# Patient Record
Sex: Female | Born: 1985 | Race: Black or African American | Hispanic: No | Marital: Single | Smoking: Never smoker
Health system: Southern US, Community
[De-identification: ages and names within clinical notes are randomized; demographics above are authoritative.]

## PROBLEM LIST (undated history)

## (undated) HISTORY — PX: CHOLECYSTECTOMY: SHX55

---
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
## 2006-07-08 ENCOUNTER — Emergency Department (HOSPITAL_COMMUNITY): Admission: EM | Admit: 2006-07-08 | Discharge: 2006-07-08 | Payer: Self-pay | Admitting: Emergency Medicine

## 2006-12-28 ENCOUNTER — Emergency Department (HOSPITAL_COMMUNITY): Admission: EM | Admit: 2006-12-28 | Discharge: 2006-12-28 | Payer: Self-pay | Admitting: Family Medicine

## 2007-02-02 ENCOUNTER — Emergency Department (HOSPITAL_COMMUNITY): Admission: EM | Admit: 2007-02-02 | Discharge: 2007-02-02 | Payer: Self-pay | Admitting: Emergency Medicine

## 2010-11-24 NOTE — Discharge Summary (Signed)
[place]  Patient:    Christie Olson, Christie Olson Visit Number: [id number] MRN: [id number]          Service Type: SUR Location: PEDS 6156 01 Attending Physician:  Leonia Corona Dictated by:   Judie Petit. Leonia Corona, M.D. 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  POSTOPERATIVE DIAGNOSIS:  Pancreatic duct cyst, status post open cholecystectomy and intraoperative cholangiogram.  HISTORY OF PRESENT ILLNESS:  This 25 year old female patient who has been admitted to the hospital earlier for acute epigastric pain was diagnosed to have pancreatitis secondary to choledochal cyst.  She was scheduled for excision of choledochal cyst with a cholecystectomy and intraoperative cholangiogram on the day of admission.  PHYSICAL EXAMINATION:  On physical examination before surgery she was otherwise healthy-appearing female child with benign abdominal examination. She was afebrile with normal vital signs, and CBC and liver function tests were within normal limits.  Abdominal examination was completely benign.  Her MRI and CT scans were consistent with a choledochal cyst, and some chronic changes in the gallbladder.  Hence, the procedure.  HOSPITAL COURSE:  On the day of surgery she was taken to the operating room and the procedure was performed under general anesthesia.  A central venous line in the right subclavian vein was placed at the beginning of the surgery, followed by an exploratory laparotomy, upon which the cystic duct was dissected, and an intraoperative cholangiogram was performed.  The intraoperative cholangiogram findings did not match the findings on MRI and CT scan noted earlier.  We were able to see the terminal common bile duct which was originating from pancreatic duct, and also a blind ending pouch on  the left hepatic duct, and some dilatation of the common bile duct, changing our diagnosis to pancreatic duct cyst causing compression over the common bile duct and dilatation of the common bile duct.  Hence, the planned procedure of excision of choledochal cyst was changed to only and open cholecystectomy, and no further mobilization of the common duct was done as planned earlier.  The surgery was smooth and uneventful, although it went on for such a long period of time due to controversial intraoperative cholangiogram findings. Postoperatively, she remained in the recovery room for a short while, and then was transferred to the pediatric floor for postoperative pain control and postoperative management.  She had also received an epidural catheter placed in the operating room for pain management.  Her postoperative course on the floor was smooth and uneventful.  Pain was very well controlled with epidural medications.  She was kept n.p.o. with IV fluids.  She remained afebrile.  On the day of surgery in the evening we noticed she complained of a little chest pain on the right side, and chest x-ray showed a very small pneumothorax, apical and extending slightly on the lateral side, however, she maintained 100% saturations on room air, no intervention was therefore considered necessary.  She was observed.  The next morning the x-ray did not show any further pneumothorax on the right side. She remained n.p.o. for the first two days, and on the third postoperative day her NG tube was removed, and her labs were within normal limits.  She was receiving cefotetan antibiotic perioperatively.  She was started on clear  liquids on the fourth postoperative day, which was gradually advanced to full liquids and then a soft diet.  On the day of discharge on May 11, 2001, which is the sixth postoperative day, she was in good general condition, afebrile, was able to walk around, and was tolerating  soft diet.  The incision was clean, dry, and intact, and healing very well.  She was discharged from the hospital with oral pain medication, and instructions to follow up in the office in one weeks time. Dictated by:   Judie Petit. Leonia Corona, M.D. Attending Physician:  Leonia Corona DD:  05/21/01 TD:  05/21/01 Job: [id number]

## 2010-11-24 NOTE — Op Note (Signed)
[place]. [facility]  Patient:    Christie Olson, Christie Olson Visit Number: [id number] MRN: [id number]          Service Type: SUR Location: PEDS 6148 01 Attending Physician:  Leonia Corona Dictated by:   Judie Petit. Leonia Corona, M.D. Proc. Date: 05/06/01 Admit Date:  05/05/2001   CC:         Linward Headland, M.D.                           Operative Report  The patient is a 25 year old female child.  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  PROCEDURES PERFORMED: 1. Placement of central venous line in right subclavian vein. 2. Exploratory laparotomy with open cholecystectomy. 3. Intraoperative cholangiopancreatogram.  SURGEON:  M. Leonia Corona, M.D.  ASSISTANTDonnella Bi D. Pendse, M.D.  ANESTHESIA:  General endotracheal.  INDICATIONS FOR PROCEDURE:  This 25 year old female child was admitted through the emergency room about a month ago for acute upper abdominal pain which was diagnosed to be acute pancreatitis with possibly cholecystitis with raised pancreatic enzymes and liver enzymes and bilirubin.  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 The patient was initially treated non-operatively, and then discharged to home for a planned surgery of excision of the choledochal cyst and Roux-en-Y anastomosis, hence this procedure.  DESCRIPTION OF PROCEDURE:  The patient is brought into the operating room, placed supine on operating room table.  General endotracheal tube anesthesia is given.  A Foley catheter #14 was placed with sterile precautions.  The right side of the chest and down to the neck was cleaned, prepped and draped in usual manner for the  placement of the central venous line.  The patient was put in Trendelenburg position.  A triple lumen catheter 7 [place] kit was used for access to the right subclavian vein.  The access was obtained with four puncture, and free backflow of the venous blood was obtained.  With the long needle-syringe the ______ was disconnected and a guide wire was introduced under fluoroscopic control into the right side of the heart, and the needle was withdrawn.  The tract was dilated using the dilator available in the kit, and then the triple lumen catheter was fed over the guide wire without any difficulty.  The guide wire was withdrawn.  The ports were flushed with normal saline, and then the catheter was secured in the skin with 3-0 silk sutures and a sterile gauze was placed.  The line was available for immediate use. The patient was returned back to a flat position.  The entire abdomen was cleaned, prepped and draped in usual manner.  We changed gowns, and once again got ready for the main part of the procedure which was exploratory laparotomy. A right subcostal incision, approximately 1.5 to 2 cm below the right subcostal margin was made, starting just to the left of the midline and going subcostally on the right side obliquely, and incision was made with a knife, and then deepened through the subcutaneous tissues with electrocautery.  The muscles and sheath were incised in the line of the incision, and then the peritoneum was opened in the middle.  After a small opening into the peritoneum, the finger  was inserted in the peritoneum, and with the help of cautery, the abdomen was opened along the entire length of the incision.  A preliminary examination of the liver surface was done.  Liver appeared to be normal without any microscopic abnormality.  The gallbladder appeared to be very thickened, larger in size.  There were no pericystic fluid or any collection around the gallbladder.  However, the  small bowel was runned from the ligament of Treitz to the ileocecal junction without any obvious abnormality.  The first loop of the jejunum was chosen, and marked with the help of silk suture on the ______ border for suture used off ______ for the hepaticojejunostomy, as per the plan.  We now placed a self-retaining retractor.  Buchwalter retractor was placed, and all the loops of the bowel were packed inferiorly.  A good access of the portahepatis was obtained.  The structures of the portahepatus were examined.  The cystic duct appeared to be very prominent and full.  The entire gallbladder was full.  We started by mobilizing the duodenum.  The second and third part of the duodenum was carefully mobilized by incising the overlying peritoneum and carefully taking it down and mobilizing the duodenum towards the medial size.  Thus cauterization of the duodenum was achieved.  After that, we turned towards the junction of the cystic duct and the common bile duct which appeared to be fairly good size, about 1 cm in size.  A very careful dissection around the common bile duct was done with the help of blunted right angle, very careful dissection posteriorly was done to get a good plane between the portal vein and the common bile duct.  The junction of the cystic duct and common bile duct were cleared from all sides circumferentially, and then a vessel loop was passed behind the common bile duct.  The distal dissection of the common bile duct towards the duodenum was done as far down as the wall of the duodenum, however, no further dissection into the wall of the duodenum was done.  At this point, we decided to prepare for the intraoperative cholangiogram.  We started by mobilizing the gallbladder.  The fundus of the gallbladder was held with ______ forceps and the gallbladder was dissected free from the gallbladder fossa with the help of a cautery until the infundibulum was reached, at which  point we dissected the cystic duct carefully with the help of a blunted right angle forceps to go around it.  The cystic duct was  dissected free on all sides, and a vessel loop was passed behind it, and cystic duct was clipped.  At this point, the cystic artery was very well visualized in the ______ angle which was now divided between two clips, and clips were applied.  The gallbladder was almost free at this point.  We were ready for intraoperative cholangiogram.  A ______ catheter was now introduced into the cystic duct by making a small opening with the 11 blade into the cystic duct, and a clamp was applied without cholangiogram catheter of free flushing was done with normal saline which was followed by contrast approximately 5 to 7 cc of contrast was injected under direct fluoroscopic control, and the entire biliary system was visualized.  Several pictures were obtained, however, no clear conclusion was drawn from the distal part of the common bile duct, and no delineation of the presumed choledochal cyst was noted.  The radiologist were consulted to give opinion on these pictures, who could  not very clearly define the choledochal cyst.  We therefore requested the presence of the radiologist in the operating room.  Under his advice, several other pictures were obtained once more by injecting a second time the contrast through the cystic duct to obtain a few more views of cholangiogram, and finally we got cleared ______ appreciable pictures separating the common bile duct from the pancreatic duct, and presumed choledochal cyst on the CAT scan and MRCP actually turned out to be congenital pancreatic ductal cyst. Although there was some amount of hold up at the ampulla of Vater of free flow of the contrast could be seen into the duodenum.  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  In view of these changed diagnosis from choledochal cyst to pancreatic duct  cyst, and an additional pouch in the left hepatic duct, we changed our plan from doing a Roux-en-Y hepaticojejunostomy to a simple open cholecystectomy, as no further dissection of the common bile duct was done.  Hence, we divided the gallbladder from the cystic duct, and multiple clips were applied on the cystic duct.  Hence, the gallbladder was removed from the field.  The peritoneal cavity was irrigated with normal warm saline.  Once again, the entire portahepatus was noted, no venous bleeding was noted from the gallbladder fossa.  The common hepatic duct, common bile duct appeared to be well distended and well visualized without any evidence of stricture or obstruction.  We therefore decided to do only an open cholecystectomy, and not do any kind of biliary anastomosis.  The previously placed marker on the jejunal loop using silk was also removed.  The peritoneal cavity was once again irrigated with copious amounts of normal saline, and then loops of the small bowel were placed back normally, and all the packs were removed.  The abdomen was closed in layers.  The peritoneum using 2-0 Vicryl continuous stitches, and then the muscles were approximated using 2-0 Vicryl interrupted stitches, and the skin was closed in two layers; the subcutaneous layer using 3-0 Vicryl interrupted stitch and the skin with 4-0 Monocryl subcuticular stitch.  Steri-Strips were applied which was covered with sterile gauze and Tegaderm dressing.  The patient tolerated the procedure very well.  The patient was hemodynamically stable throughout the procedure.  ESTIMATED BLOOD LOSS:  Less than 30 cc.  The patient was given about 2000 cc of IV fluids, crystalloids during the procedure.  The patient made about 300 cc of urine.  The patient was extubated, and transported to the recovery room after placement of an epidural catheter in good and stable condition. Dictated by:   Judie Petit. Leonia Corona, M.D. Attending  Physician:  Leonia Corona DD:  05/06/01 TD:  05/07/01 Job: [id number]

## 2011-04-23 LAB — DIFFERENTIAL
Basophils Absolute: 0
Basophils Relative: 0
Neutro Abs: 7.8 — ABNORMAL HIGH
Neutrophils Relative %: 77

## 2011-04-23 LAB — CBC
MCHC: 34.3
Platelets: 408 — ABNORMAL HIGH
RBC: 4.16
RDW: 13.6

## 2011-04-25 LAB — POCT URINALYSIS DIP (DEVICE)
Operator id: 239701
Protein, ur: 30 — AB
Urobilinogen, UA: 1
pH: 6.5

## 2011-04-25 LAB — POCT PREGNANCY, URINE
Operator id: 239701
Preg Test, Ur: NEGATIVE

## 2012-06-05 ENCOUNTER — Emergency Department (HOSPITAL_COMMUNITY)
Admission: EM | Admit: 2012-06-05 | Discharge: 2012-06-06 | Disposition: A | Discharge status: Home / Self Care | Payer: Worker's Compensation | Attending: Emergency Medicine | Admitting: Emergency Medicine

## 2012-06-05 ENCOUNTER — Emergency Department (HOSPITAL_COMMUNITY): Payer: Worker's Compensation

## 2012-06-05 ENCOUNTER — Encounter (HOSPITAL_COMMUNITY): Payer: Self-pay | Admitting: Emergency Medicine

## 2012-06-05 DIAGNOSIS — Y9289 Other specified places as the place of occurrence of the external cause: Secondary | ICD-10-CM | POA: Insufficient documentation

## 2012-06-05 DIAGNOSIS — R110 Nausea: Secondary | ICD-10-CM | POA: Insufficient documentation

## 2012-06-05 DIAGNOSIS — Y9301 Activity, walking, marching and hiking: Secondary | ICD-10-CM | POA: Insufficient documentation

## 2012-06-05 DIAGNOSIS — H53149 Visual discomfort, unspecified: Secondary | ICD-10-CM | POA: Insufficient documentation

## 2012-06-05 DIAGNOSIS — W1809XA Striking against other object with subsequent fall, initial encounter: Secondary | ICD-10-CM | POA: Insufficient documentation

## 2012-06-05 DIAGNOSIS — S0990XA Unspecified injury of head, initial encounter: Secondary | ICD-10-CM | POA: Insufficient documentation

## 2012-06-05 MED ORDER — ONDANSETRON 8 MG PO TBDP
8.0000 mg | ORAL_TABLET | Freq: Once | ORAL | Status: AC
  Administered 2012-06-05: 8 mg via ORAL
  Filled 2012-06-05: qty 1

## 2012-06-05 NOTE — ED Notes (Signed)
Pt states she was leaving work and slipped on some ice and fell forward striking her head and her right knee on the pavement   Denies LOC   Pt is c/o nausea and states light hurts her eyes

## 2012-06-05 NOTE — ED Provider Notes (Signed)
History     CSN: [id number]  Arrival date & time 06/05/12  2221   First MD Initiated Contact with Patient 06/05/12 2300      Chief Complaint  Patient presents with  . Fall  . Head Injury    (Consider location/radiation/quality/duration/timing/severity/associated sxs/prior treatment) HPI Comments: Patient states she slipped on ice hitting her forehead on the ground Denies LOC, neck pain other injury,  now has nausea without vomiting   Patient is a 26 y.o. female presenting with fall and head injury. The history is provided by the patient.  Fall The accident occurred 1 to 2 hours ago. The fall occurred while walking. She fell from a height of 3 to 5 ft. She landed on concrete. The point of impact was the head. The pain is present in the head. The pain is at a severity of 4/10. The pain is moderate. She was ambulatory at the scene. There was no entrapment after the fall. There was no drug use involved in the accident. There was no alcohol use involved in the accident. Associated symptoms include nausea. Pertinent negatives include no visual change, no fever, no vomiting, no headaches and no loss of consciousness. She has tried nothing for the symptoms.  Head Injury  Pertinent negatives include no vomiting and no weakness.    History reviewed. No pertinent past medical history.  Past Surgical History  Procedure Date  . Cholecystectomy     Family History  Problem Relation Age of Onset  . Diabetes Other   . Cancer Other   . Hypertension Other     History  Substance Use Topics  . Smoking status: Never Smoker   . Smokeless tobacco: Not on file  . Alcohol Use: Yes     Comment: socially    OB History    Grav Para Term Preterm Abortions TAB SAB Ect Mult Living                  Review of Systems  Constitutional: Negative for fever and activity change.  HENT: Negative for neck pain and neck stiffness.   Eyes: Positive for photophobia.  Gastrointestinal: Positive for  nausea. Negative for vomiting.  Skin: Negative for wound.  Neurological: Negative for dizziness, loss of consciousness, weakness and headaches.    Allergies  Review of patient's allergies indicates no known allergies.  Home Medications   Current Outpatient Rx  Name  Route  Sig  Dispense  Refill  . ONDANSETRON HCL 4 MG PO TABS   Oral   Take 1 tablet (4 mg total) by mouth every 6 (six) hours.   12 tablet   0     BP 124/73  Pulse 67  Temp 98.1 F (36.7 C) (Oral)  Resp 18  SpO2 99%  LMP 04/06/2012  Physical Exam  Constitutional: She is oriented to person, place, and time. She appears well-developed and well-nourished.  HENT:  Head: Normocephalic.  Eyes: Pupils are equal, round, and reactive to light.  Neck: Normal range of motion.  Cardiovascular: Normal rate.   Musculoskeletal: Normal range of motion.  Neurological: She is alert and oriented to person, place, and time.  Skin: Skin is warm.    ED Course  Procedures (including critical care time)  Labs Reviewed - No data to display Ct Head Wo Contrast  06/05/2012  *RADIOLOGY REPORT*  Clinical Data: Status post fall; hit head.  Nausea and photophobia.  CT HEAD WITHOUT CONTRAST  Technique:  Contiguous axial images were obtained from the base  of the skull through the vertex without contrast.  Comparison: None.  Findings: There is no evidence of acute infarction, mass lesion, or intra- or extra-axial hemorrhage on CT.  The posterior fossa, including the cerebellum, brainstem and fourth ventricle, is within normal limits.  The third and lateral ventricles, and basal ganglia are unremarkable in appearance.  The cerebral hemispheres are symmetric in appearance, with normal gray- white differentiation.  No mass effect or midline shift is seen.  There is no evidence of fracture; visualized osseous structures are unremarkable in appearance.  The visualized portions of the orbits are within normal limits.  The paranasal sinuses and  mastoid air cells are well-aerated.  No significant soft tissue abnormalities are seen.  IMPRESSION: No evidence of traumatic intracranial injury or fracture.   Original Report Authenticated By: Tonia Ghent, M.D.      1. Minor head injury       MDM  After Zofran nausea has resolved but still slightly photophobic  CT Scan reviewed no acute pathology to explain symptoms        Arman Filter, NP 06/06/12 0030

## 2012-06-05 NOTE — ED Notes (Signed)
Pt sts she was at work when she fell in the parking lot and hit her head due to ice on the asphalt. Patient sts no LOC, alert and oriented. Pt sts no dizziness or confusion. Sts she has felt slightly nauseous, medicated patient with 8mg  zofran ODT.

## 2012-06-06 MED ORDER — ONDANSETRON HCL 4 MG PO TABS: 4.0000 mg | ORAL_TABLET | Freq: Four times a day (QID) | ORAL | Status: AC

## 2012-06-06 NOTE — ED Notes (Signed)
Patient given discharge instructions, information, prescriptions, and diet order. Patient states that they adequately understand discharge information given and to return to ED if symptoms return or worsen.     

## 2012-06-06 NOTE — ED Provider Notes (Signed)
Medical screening examination/treatment/procedure(s) were performed by non-physician practitioner and as supervising physician I was immediately available for consultation/collaboration.    Vida Roller, MD 06/06/12 [phone]

## 2013-12-27 IMAGING — CR DG CERVICAL SPINE 2-3V
2 series · 2 of 2 positions shown · non-contrast
Comparison: None.

CLINICAL DATA: Pain

CERVICAL SPINE - 2-3 VIEW

[lateral]
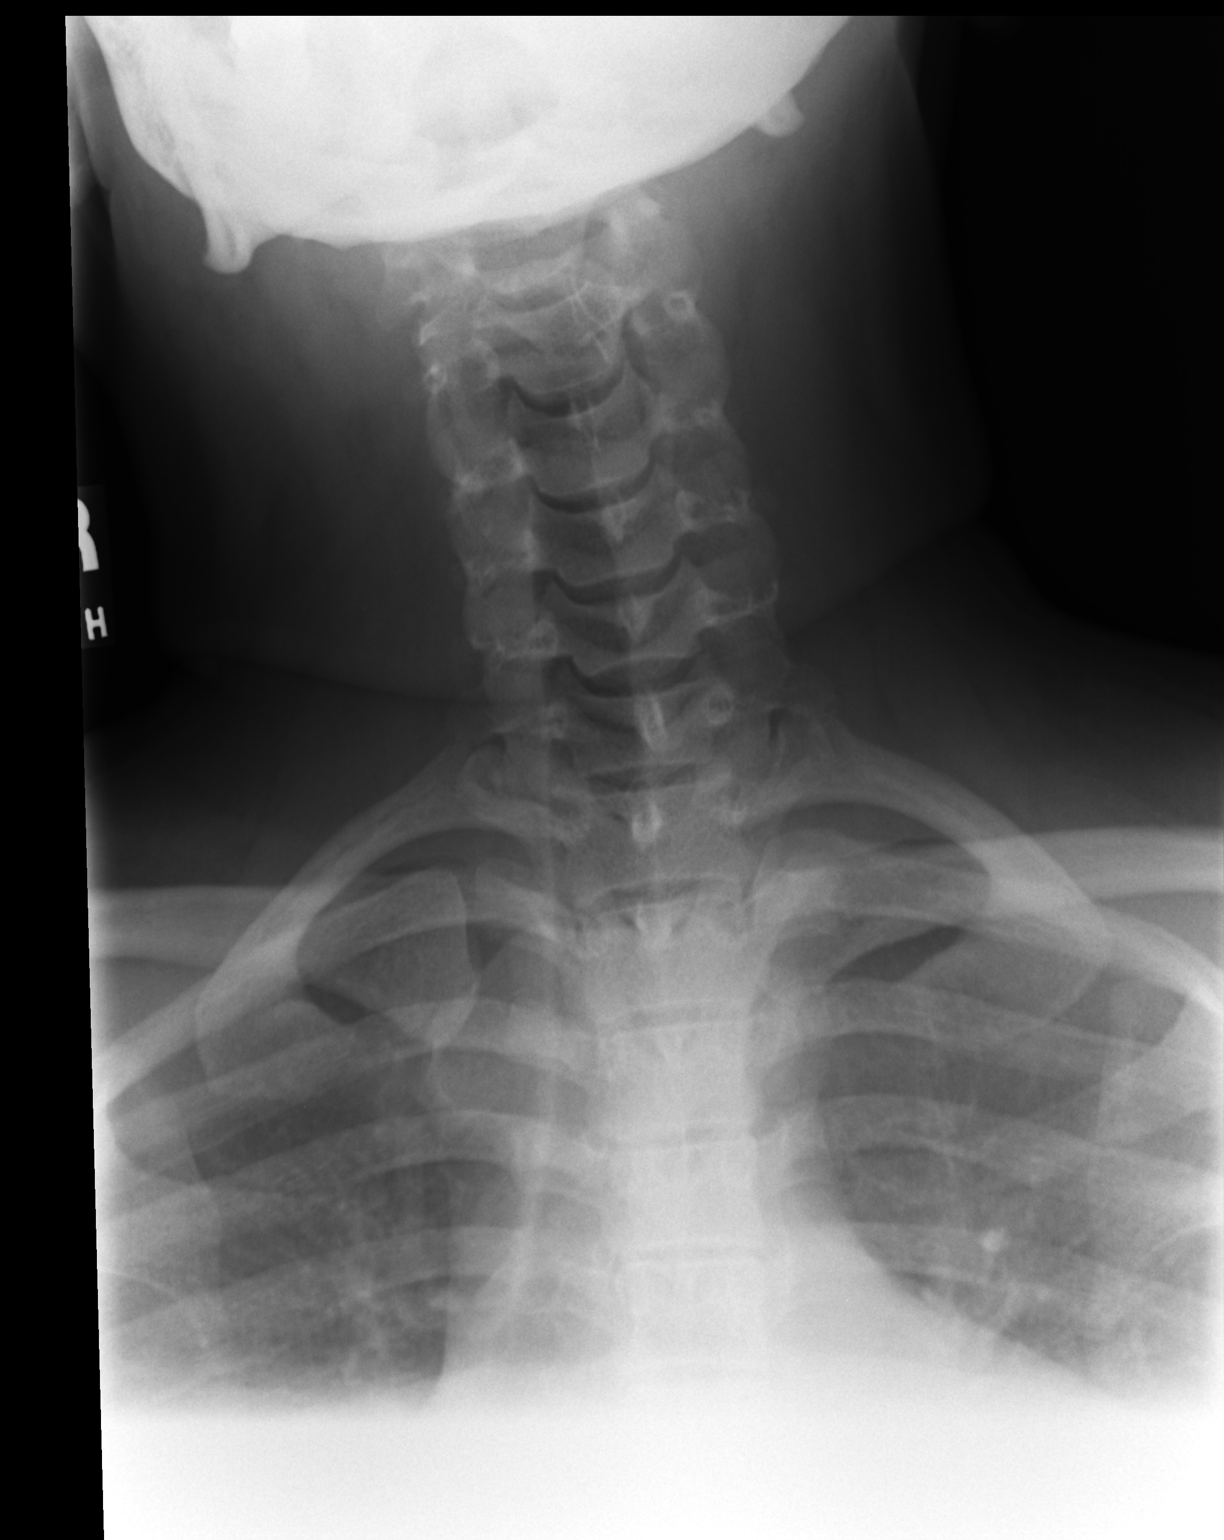

[AP]
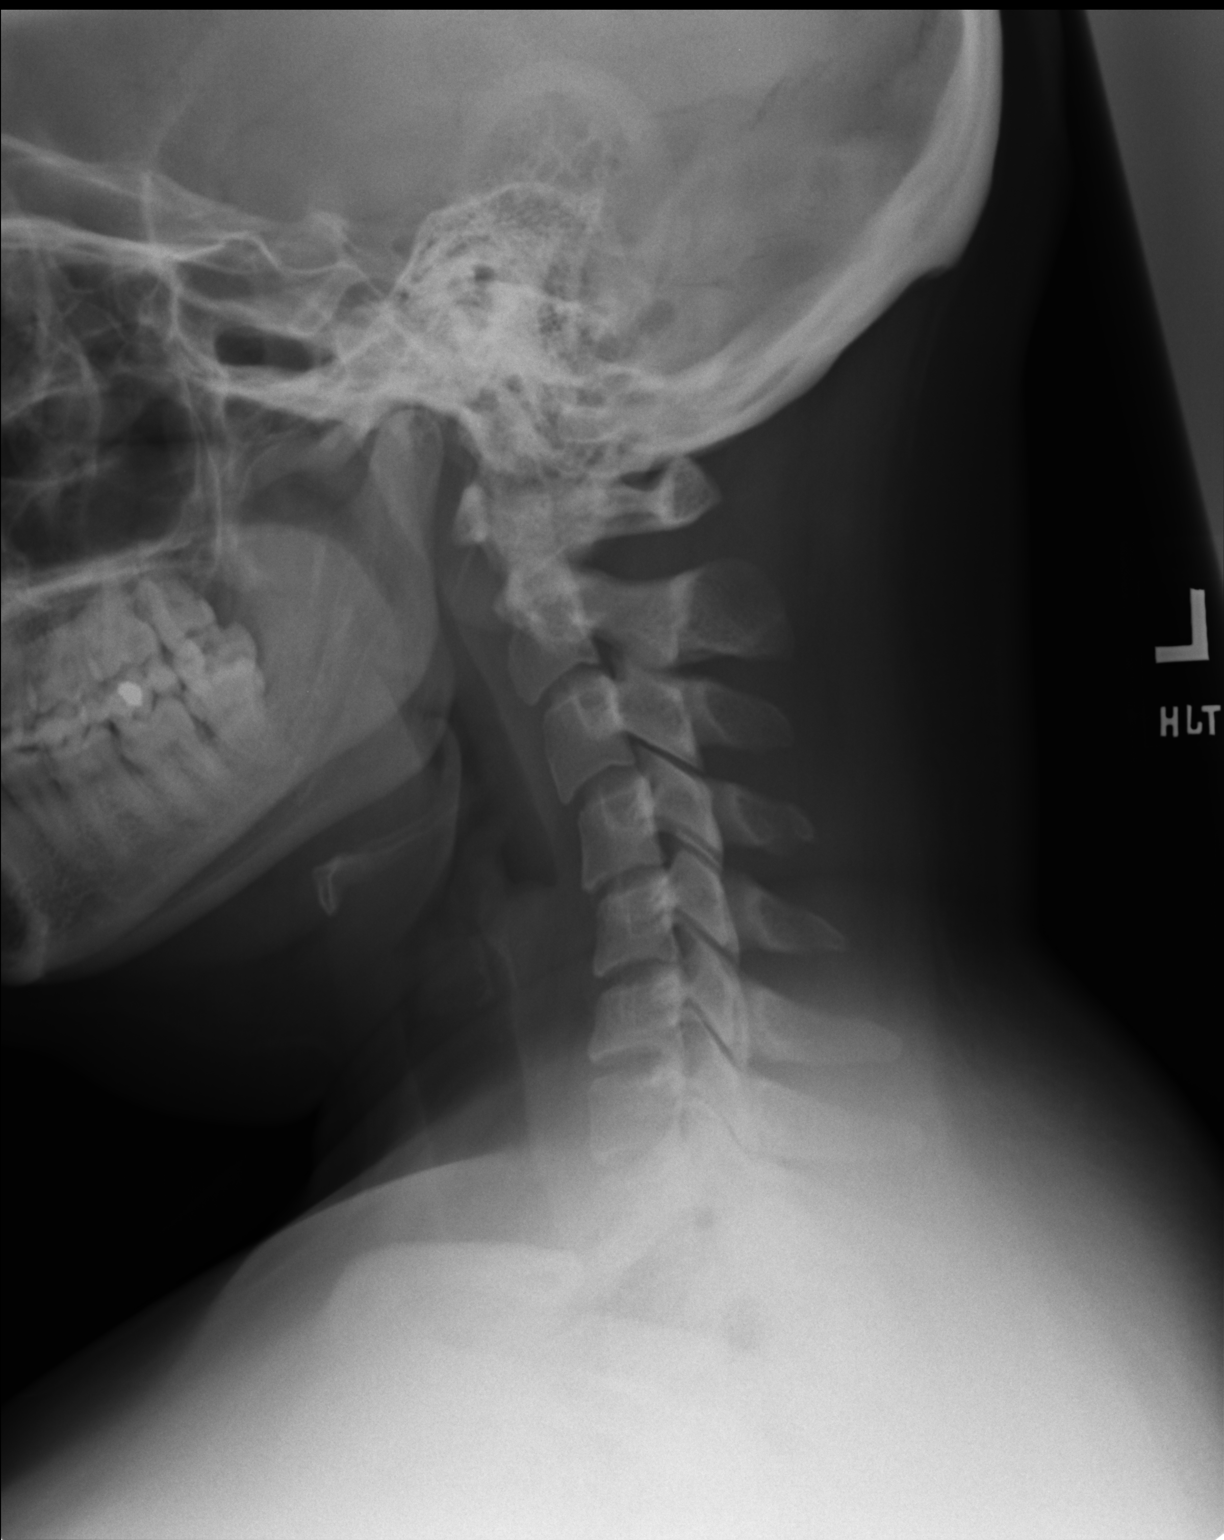

[2 of 2 positions shown; findings below may reference images not displayed]

FINDINGS: Frontal and lateral views were obtained.  There is
cervicothoracic levoscoliosis.  There is reversal lordotic
curvature.

There is no fracture or spondylolisthesis.  Prevertebral soft
tissues and predental space regions are normal.  Disc spaces appear
intact.  No erosive change.
IMPRESSION: Scoliosis and reversal lordotic curvature.  Suspect muscle spasm.
There is concern for ligamentous injury, lateral flexion and
extension views could be helpful to further assess.

No fracture or spondylolisthesis.  No appreciable arthropathic
change.

## 2015-05-28 ENCOUNTER — Encounter (HOSPITAL_BASED_OUTPATIENT_CLINIC_OR_DEPARTMENT_OTHER): Payer: Self-pay | Admitting: Emergency Medicine

## 2015-05-28 ENCOUNTER — Emergency Department (HOSPITAL_BASED_OUTPATIENT_CLINIC_OR_DEPARTMENT_OTHER)
Admission: EM | Admit: 2015-05-28 | Discharge: 2015-05-28 | Disposition: A | Discharge status: Home / Self Care | Payer: Commercial Managed Care - PPO | Attending: Emergency Medicine | Admitting: Emergency Medicine

## 2015-05-28 DIAGNOSIS — M542 Cervicalgia: Secondary | ICD-10-CM | POA: Diagnosis present

## 2015-05-28 MED ORDER — NAPROXEN 500 MG PO TABS: 500.0000 mg | ORAL_TABLET | Freq: Two times a day (BID) | ORAL | Status: AC

## 2015-05-28 MED ORDER — METHOCARBAMOL 500 MG PO TABS: 500.0000 mg | ORAL_TABLET | Freq: Two times a day (BID) | ORAL | Status: AC

## 2015-05-28 MED ORDER — KETOROLAC TROMETHAMINE 60 MG/2ML IM SOLN
60.0000 mg | Freq: Once | INTRAMUSCULAR | Status: AC
  Administered 2015-05-28: 60 mg via INTRAMUSCULAR
  Filled 2015-05-28: qty 2

## 2015-05-28 NOTE — ED Notes (Signed)
Patient states that she is having pain to her neck  - down her left side, and her left neck

## 2015-05-28 NOTE — ED Notes (Signed)
Patient is moving her neck without any grimacing or in any distress

## 2015-05-28 NOTE — Discharge Instructions (Signed)
Scheduled follow-up appointment with your primary care doctor. Robaxin as a muscle relaxer and will make you drowsy. Do not take before driving.   Cervical Strain and Sprain With Rehab Cervical strain and sprain are injuries that commonly occur with "whiplash" injuries. Whiplash occurs when the neck is forcefully whipped backward or forward, such as during a motor vehicle accident or during contact sports. The muscles, ligaments, tendons, discs, and nerves of the neck are susceptible to injury when this occurs. RISK FACTORS Risk of having a whiplash injury increases if:  Osteoarthritis of the spine.  Situations that make head or neck accidents or trauma more likely.  High-risk sports (football, rugby, wrestling, hockey, auto racing, gymnastics, diving, contact karate, or boxing).  Poor strength and flexibility of the neck.  Previous neck injury.  Poor tackling technique.  Improperly fitted or padded equipment. SYMPTOMS   Pain or stiffness in the front or back of neck or both.  Symptoms may present immediately or up to 24 hours after injury.  Dizziness, headache, nausea, and vomiting.  Muscle spasm with soreness and stiffness in the neck.  Tenderness and swelling at the injury site. PREVENTION  Learn and use proper technique (avoid tackling with the head, spearing, and head-butting; use proper falling techniques to avoid landing on the head).  Warm up and stretch properly before activity.  Maintain physical fitness:  Strength, flexibility, and endurance.  Cardiovascular fitness.  Wear properly fitted and padded protective equipment, such as padded soft collars, for participation in contact sports. PROGNOSIS  Recovery from cervical strain and sprain injuries is dependent on the extent of the injury. These injuries are usually curable in 1 week to 3 months with appropriate treatment.  RELATED COMPLICATIONS   Temporary numbness and weakness may occur if the nerve roots  are damaged, and this may persist until the nerve has completely healed.  Chronic pain due to frequent recurrence of symptoms.  Prolonged healing, especially if activity is resumed too soon (before complete recovery). TREATMENT  Treatment initially involves the use of ice and medication to help reduce pain and inflammation. It is also important to perform strengthening and stretching exercises and modify activities that worsen symptoms so the injury does not get worse. These exercises may be performed at home or with a therapist. For patients who experience severe symptoms, a soft, padded collar may be recommended to be worn around the neck.  Improving your posture may help reduce symptoms. Posture improvement includes pulling your chin and abdomen in while sitting or standing. If you are sitting, sit in a firm chair with your buttocks against the back of the chair. While sleeping, try replacing your pillow with a small towel rolled to 2 inches in diameter, or use a cervical pillow or soft cervical collar. Poor sleeping positions delay healing.  For patients with nerve root damage, which causes numbness or weakness, the use of a cervical traction apparatus may be recommended. Surgery is rarely necessary for these injuries. However, cervical strain and sprains that are present at birth (congenital) may require surgery. MEDICATION   If pain medication is necessary, nonsteroidal anti-inflammatory medications, such as aspirin and ibuprofen, or other minor pain relievers, such as acetaminophen, are often recommended.  Do not take pain medication for 7 days before surgery.  Prescription pain relievers may be given if deemed necessary by your caregiver. Use only as directed and only as much as you need. HEAT AND COLD:   Cold treatment (icing) relieves pain and reduces inflammation. Cold treatment  should be applied for 10 to 15 minutes every 2 to 3 hours for inflammation and pain and immediately after any  activity that aggravates your symptoms. Use ice packs or an ice massage.  Heat treatment may be used prior to performing the stretching and strengthening activities prescribed by your caregiver, physical therapist, or athletic trainer. Use a heat pack or a warm soak. SEEK MEDICAL CARE IF:   Symptoms get worse or do not improve in 2 weeks despite treatment.  New, unexplained symptoms develop (drugs used in treatment may produce side effects). EXERCISES RANGE OF MOTION (ROM) AND STRETCHING EXERCISES - Cervical Strain and Sprain These exercises may help you when beginning to rehabilitate your injury. In order to successfully resolve your symptoms, you must improve your posture. These exercises are designed to help reduce the forward-head and rounded-shoulder posture which contributes to this condition. Your symptoms may resolve with or without further involvement from your physician, physical therapist or athletic trainer. While completing these exercises, remember:   Restoring tissue flexibility helps normal motion to return to the joints. This allows healthier, less painful movement and activity.  An effective stretch should be held for at least 20 seconds, although you may need to begin with shorter hold times for comfort.  A stretch should never be painful. You should only feel a gentle lengthening or release in the stretched tissue. STRETCH- Axial Extensors  Lie on your back on the floor. You may bend your knees for comfort. Place a rolled-up hand towel or dish towel, about 2 inches in diameter, under the part of your head that makes contact with the floor.  Gently tuck your chin, as if trying to make a "double chin," until you feel a gentle stretch at the base of your head.  Hold __________ seconds. Repeat __________ times. Complete this exercise __________ times per day.  STRETCH - Axial Extension   Stand or sit on a firm surface. Assume a good posture: chest up, shoulders drawn back,  abdominal muscles slightly tense, knees unlocked (if standing) and feet hip width apart.  Slowly retract your chin so your head slides back and your chin slightly lowers. Continue to look straight ahead.  You should feel a gentle stretch in the back of your head. Be certain not to feel an aggressive stretch since this can cause headaches later.  Hold for __________ seconds. Repeat __________ times. Complete this exercise __________ times per day. STRETCH - Cervical Side Bend   Stand or sit on a firm surface. Assume a good posture: chest up, shoulders drawn back, abdominal muscles slightly tense, knees unlocked (if standing) and feet hip width apart.  Without letting your nose or shoulders move, slowly tip your right / left ear to your shoulder until your feel a gentle stretch in the muscles on the opposite side of your neck.  Hold __________ seconds. Repeat __________ times. Complete this exercise __________ times per day. STRETCH - Cervical Rotators   Stand or sit on a firm surface. Assume a good posture: chest up, shoulders drawn back, abdominal muscles slightly tense, knees unlocked (if standing) and feet hip width apart.  Keeping your eyes level with the ground, slowly turn your head until you feel a gentle stretch along the back and opposite side of your neck.  Hold __________ seconds. Repeat __________ times. Complete this exercise __________ times per day. RANGE OF MOTION - Neck Circles   Stand or sit on a firm surface. Assume a good posture: chest up, shoulders drawn  back, abdominal muscles slightly tense, knees unlocked (if standing) and feet hip width apart.  Gently roll your head down and around from the back of one shoulder to the back of the other. The motion should never be forced or painful.  Repeat the motion 10-20 times, or until you feel the neck muscles relax and loosen. Repeat __________ times. Complete the exercise __________ times per day. STRENGTHENING EXERCISES  - Cervical Strain and Sprain These exercises may help you when beginning to rehabilitate your injury. They may resolve your symptoms with or without further involvement from your physician, physical therapist, or athletic trainer. While completing these exercises, remember:   Muscles can gain both the endurance and the strength needed for everyday activities through controlled exercises.  Complete these exercises as instructed by your physician, physical therapist, or athletic trainer. Progress the resistance and repetitions only as guided.  You may experience muscle soreness or fatigue, but the pain or discomfort you are trying to eliminate should never worsen during these exercises. If this pain does worsen, stop and make certain you are following the directions exactly. If the pain is still present after adjustments, discontinue the exercise until you can discuss the trouble with your clinician. STRENGTH - Cervical Flexors, Isometric  Face a wall, standing about 6 inches away. Place a small pillow, a ball about 6-8 inches in diameter, or a folded towel between your forehead and the wall.  Slightly tuck your chin and gently push your forehead into the soft object. Push only with mild to moderate intensity, building up tension gradually. Keep your jaw and forehead relaxed.  Hold 10 to 20 seconds. Keep your breathing relaxed.  Release the tension slowly. Relax your neck muscles completely before you start the next repetition. Repeat __________ times. Complete this exercise __________ times per day. STRENGTH- Cervical Lateral Flexors, Isometric   Stand about 6 inches away from a wall. Place a small pillow, a ball about 6-8 inches in diameter, or a folded towel between the side of your head and the wall.  Slightly tuck your chin and gently tilt your head into the soft object. Push only with mild to moderate intensity, building up tension gradually. Keep your jaw and forehead relaxed.  Hold 10  to 20 seconds. Keep your breathing relaxed.  Release the tension slowly. Relax your neck muscles completely before you start the next repetition. Repeat __________ times. Complete this exercise __________ times per day. STRENGTH - Cervical Extensors, Isometric   Stand about 6 inches away from a wall. Place a small pillow, a ball about 6-8 inches in diameter, or a folded towel between the back of your head and the wall.  Slightly tuck your chin and gently tilt your head back into the soft object. Push only with mild to moderate intensity, building up tension gradually. Keep your jaw and forehead relaxed.  Hold 10 to 20 seconds. Keep your breathing relaxed.  Release the tension slowly. Relax your neck muscles completely before you start the next repetition. Repeat __________ times. Complete this exercise __________ times per day. POSTURE AND BODY MECHANICS CONSIDERATIONS - Cervical Strain and Sprain Keeping correct posture when sitting, standing or completing your activities will reduce the stress put on different body tissues, allowing injured tissues a chance to heal and limiting painful experiences. The following are general guidelines for improved posture. Your physician or physical therapist will provide you with any instructions specific to your needs. While reading these guidelines, remember:  The exercises prescribed by your provider  will help you have the flexibility and strength to maintain correct postures.  The correct posture provides the optimal environment for your joints to work. All of your joints have less wear and tear when properly supported by a spine with good posture. This means you will experience a healthier, less painful body.  Correct posture must be practiced with all of your activities, especially prolonged sitting and standing. Correct posture is as important when doing repetitive low-stress activities (typing) as it is when doing a single heavy-load activity  (lifting). PROLONGED STANDING WHILE SLIGHTLY LEANING FORWARD When completing a task that requires you to lean forward while standing in one place for a long time, place either foot up on a stationary 2- to 4-inch high object to help maintain the best posture. When both feet are on the ground, the low back tends to lose its slight inward curve. If this curve flattens (or becomes too large), then the back and your other joints will experience too much stress, fatigue more quickly, and can cause pain.  RESTING POSITIONS Consider which positions are most painful for you when choosing a resting position. If you have pain with flexion-based activities (sitting, bending, stooping, squatting), choose a position that allows you to rest in a less flexed posture. You would want to avoid curling into a fetal position on your side. If your pain worsens with extension-based activities (prolonged standing, working overhead), avoid resting in an extended position such as sleeping on your stomach. Most people will find more comfort when they rest with their spine in a more neutral position, neither too rounded nor too arched. Lying on a non-sagging bed on your side with a pillow between your knees, or on your back with a pillow under your knees will often provide some relief. Keep in mind, being in any one position for a prolonged period of time, no matter how correct your posture, can still lead to stiffness. WALKING Walk with an upright posture. Your ears, shoulders, and hips should all line up. OFFICE WORK When working at a desk, create an environment that supports good, upright posture. Without extra support, muscles fatigue and lead to excessive strain on joints and other tissues. CHAIR:  A chair should be able to slide under your desk when your back makes contact with the back of the chair. This allows you to work closely.  The chair's height should allow your eyes to be level with the upper part of your monitor  and your hands to be slightly lower than your elbows.  Body position:  Your feet should make contact with the floor. If this is not possible, use a foot rest.  Keep your ears over your shoulders. This will reduce stress on your neck and low back.   This information is not intended to replace advice given to you by your health care provider. Make sure you discuss any questions you have with your health care provider.   Document Released: 06/25/2005 Document Revised: 07/16/2014 Document Reviewed: 10/07/2008 Elsevier Interactive Patient Education [employer]

## 2015-05-28 NOTE — ED Provider Notes (Signed)
CSN: [id number]     Arrival date & time 05/28/15  2007 History   First MD Initiated Contact with Patient 05/28/15 2157     Chief Complaint  Patient presents with  . Neck Pain   HPI  Christie Olson is a 29 year old female presenting with neck pain she states that the pain began after she woke up this morning. The pain is left-sided and radiates into her shoulder. The pain does not radiate down to her arm. She describes the pain as sharp and stabbing. The pain is exacerbated by rotation of the neck to the left. She has not tried anything for the neck pain. She denies trauma or injury to the neck. She has no other complaints at this time. Denies fevers, chills, headaches, changes in vision, sore throat, rashes or numbness/weakness in the upper extremities.  History reviewed. No pertinent past medical history. Past Surgical History  Procedure Laterality Date  . Cholecystectomy     Family History  Problem Relation Age of Onset  . Diabetes Other   . Cancer Other   . Hypertension Other    Social History  Substance Use Topics  . Smoking status: Never Smoker   . Smokeless tobacco: None  . Alcohol Use: Yes     Comment: socially   OB History    No data available     Review of Systems  Constitutional: Negative for fever and chills.  HENT: Negative for congestion, ear pain and sore throat.   Eyes: Negative for visual disturbance.  Musculoskeletal: Positive for neck pain. Negative for myalgias, back pain, joint swelling, arthralgias and neck stiffness.  Skin: Negative for rash.  Neurological: Negative for weakness, numbness and headaches.  All other systems reviewed and are negative.     Allergies  Review of patient's allergies indicates no known allergies.  Home Medications   Prior to Admission medications   Medication Sig Start Date End Date Taking? Authorizing Provider  methocarbamol (ROBAXIN) 500 MG tablet Take 1 tablet (500 mg total) by mouth 2 (two) times daily. 05/28/15    Letroy Vazguez, PA-C  naproxen (NAPROSYN) 500 MG tablet Take 1 tablet (500 mg total) by mouth 2 (two) times daily. 05/28/15   Callin Ashe, PA-C  ondansetron (ZOFRAN) 4 MG tablet Take 1 tablet (4 mg total) by mouth every 6 (six) hours. 06/06/12   Earley Favor, NP   BP 140/74 mmHg  Pulse 88  Temp(Src) 98.3 F (36.8 C) (Oral)  Resp 16  Ht  (1.626 m)  Wt 240 lb (108.863 kg)  BMI 41.18 kg/m2  SpO2 100%  LMP 04/27/2015 (Approximate) Physical Exam  Constitutional: She appears well-developed and well-nourished. No distress.  HENT:  Head: Normocephalic and atraumatic.  Eyes: Conjunctivae are normal. Right eye exhibits no discharge. Left eye exhibits no discharge. No scleral icterus.  Neck: Normal range of motion. Neck supple. Muscular tenderness present. No spinous process tenderness present. Normal range of motion present.    Left-sided neck over upper region of trapezius tenderness to palpation. Patient retains full range of motion of neck. She states that it is painful to turn her head to the left though she frequently moves her neck without indication of pain during interview. No midline cervical tenderness. No bony deformities or step-offs.  Cardiovascular: Normal rate and regular rhythm.   Pulmonary/Chest: Effort normal. No respiratory distress.  Musculoskeletal: Normal range of motion. She exhibits no edema or tenderness.  Full range of motion of bilateral upper extremities without pain. Movement of  left arm does not exacerbate the neck pain. No obvious deformities. No pain over left humeral head.  Neurological: She is alert. Coordination normal.  5 out of 5 strength of the bilateral upper extremities. Sensation to light touch intact.  Skin: Skin is warm and dry. No rash noted.  No rash over area of pain.  Psychiatric: She has a normal mood and affect. Her behavior is normal.  Nursing note and vitals reviewed.   ED Course  Procedures (including critical care time) Labs  Review Labs Reviewed - No data to display  Imaging Review No results found. I have personally reviewed and evaluated these images and lab results as part of my medical decision-making.   EKG Interpretation None      MDM   Final diagnoses:  Neck pain   Patient presenting with 1 day of left-sided neck pain. Pain is exacerbated by rotation of the neck. Pain does not radiate. There are no associated symptoms. Vital signs stable. Patient has tenderness of the left upper trapezius between the neck and left shoulder. Full range of motion of neck and lateral upper extremities intact. Nonfocal neuro exam. Neck pain is likely a cervical musculoskeletal pain. No indications for imaging at this time. We'll treat with anti-inflammatories and muscle relaxers. Patient instructed to follow up with her primary care if her neck pain persists. Return precautions given in discharge paperwork and discussed with pt at bedside. Pt stable for discharge     Alveta HeimlichStevi Vashaun Osmon, PA-C 05/28/15 2254  Tilden FossaElizabeth Rees, MD 05/29/15 1721
